# Patient Record
Sex: Female | Born: 1945 | Race: White | Hispanic: No | Marital: Married | Smoking: Never smoker
Health system: Southern US, Community
[De-identification: ages and names within clinical notes are randomized; demographics above are authoritative.]

## PROBLEM LIST (undated history)

## (undated) DIAGNOSIS — R05 Cough: Secondary | ICD-10-CM

## (undated) DIAGNOSIS — R5383 Other fatigue: Secondary | ICD-10-CM

## (undated) DIAGNOSIS — R059 Cough, unspecified: Secondary | ICD-10-CM

## (undated) DIAGNOSIS — M1990 Unspecified osteoarthritis, unspecified site: Secondary | ICD-10-CM

## (undated) DIAGNOSIS — R509 Fever, unspecified: Secondary | ICD-10-CM

## (undated) DIAGNOSIS — R51 Headache: Secondary | ICD-10-CM

## (undated) DIAGNOSIS — R519 Headache, unspecified: Secondary | ICD-10-CM

## (undated) HISTORY — PX: TONSILLECTOMY: SUR1361

## (undated) HISTORY — DX: Cough, unspecified: R05.9

## (undated) HISTORY — DX: Cough: R05

## (undated) HISTORY — DX: Fever, unspecified: R50.9

## (undated) HISTORY — DX: Other fatigue: R53.83

## (undated) HISTORY — DX: Headache: R51

## (undated) HISTORY — PX: TUBAL LIGATION: SHX77

## (undated) HISTORY — DX: Headache, unspecified: R51.9

## (undated) HISTORY — DX: Unspecified osteoarthritis, unspecified site: M19.90

---

## 1999-07-25 ENCOUNTER — Ambulatory Visit (HOSPITAL_COMMUNITY): Admission: RE | Admit: 1999-07-25 | Discharge: 1999-07-25 | Payer: Self-pay | Admitting: Family Medicine

## 2010-09-06 ENCOUNTER — Other Ambulatory Visit: Payer: Self-pay | Admitting: Family Medicine

## 2010-09-06 DIAGNOSIS — R10811 Right upper quadrant abdominal tenderness: Secondary | ICD-10-CM

## 2010-09-07 ENCOUNTER — Ambulatory Visit
Admission: RE | Admit: 2010-09-07 | Discharge: 2010-09-07 | Disposition: A | Discharge status: Home / Self Care | Payer: Managed Care, Other (non HMO) | Source: Ambulatory Visit | Attending: Family Medicine | Admitting: Family Medicine

## 2010-09-07 DIAGNOSIS — R10811 Right upper quadrant abdominal tenderness: Secondary | ICD-10-CM

## 2010-09-27 ENCOUNTER — Encounter (INDEPENDENT_AMBULATORY_CARE_PROVIDER_SITE_OTHER): Payer: Self-pay | Admitting: General Surgery

## 2010-09-27 ENCOUNTER — Ambulatory Visit (INDEPENDENT_AMBULATORY_CARE_PROVIDER_SITE_OTHER): Payer: BC Managed Care – PPO | Admitting: General Surgery

## 2010-09-27 VITALS — BP 156/108 | HR 64 | Temp 96.4°F | Ht 64.5 in | Wt 185.8 lb

## 2010-09-27 DIAGNOSIS — K8020 Calculus of gallbladder without cholecystitis without obstruction: Secondary | ICD-10-CM

## 2010-09-27 NOTE — Patient Instructions (Signed)
Plan for surgery

## 2010-09-27 NOTE — Progress Notes (Signed)
Subjective:     Patient ID: Whitney Burnett, female   DOB: [birth year], 65 y.o.   MRN: [id number]    BP 156/108  Pulse 64  Temp 96.4 F (35.8 C)  Ht 5' 4.5" (1.638 m)  Wt 185 lb 12.8 oz (84.278 kg)  BMI 31.40 kg/m2    HPI The patient is a 65 year old white female who has been experiencing epigastric pain for the last month or 2. The pain is in her epigastric region. The pain has been associated with nausea but no vomiting. She's been running some low-grade fevers at home. She usually experiences severe cramping after eating. This usually occurs in the evening or 2 hours after a meal.  Review of Systems  Constitutional: Positive for fever and fatigue.  HENT: Negative.   Eyes: Negative.   Respiratory: Negative.   Cardiovascular: Negative.   Gastrointestinal: Positive for abdominal pain.  Genitourinary: Positive for hematuria.  Musculoskeletal: Negative.   Neurological: Negative.   Hematological: Negative.   Psychiatric/Behavioral: Negative.    Past Medical History  Diagnosis Date  . Fever   . Fatigue   . Cough     began with gallstones  . Arthritis   . New onset of headaches   . Hematuria    .all Past Surgical History  Procedure Date  . Tubal ligation   . Tonsillectomy    No current outpatient prescriptions on file. No Known Allergies    Objective:   Physical Exam  Constitutional: She is oriented to person, place, and time. She appears well-developed and well-nourished.  HENT:  Head: Normocephalic and atraumatic.  Eyes: EOM are normal. Pupils are equal, round, and reactive to light.  Neck: Neck supple.  Cardiovascular: Normal rate and regular rhythm.   Pulmonary/Chest: Effort normal and breath sounds normal.  Abdominal: Soft. Bowel sounds are normal. There is tenderness.  Musculoskeletal: Normal range of motion.  Neurological: She is alert and oriented to person, place, and time.  Skin: Skin is warm and dry.  Psychiatric: She has a normal mood and affect. Her  behavior is normal.       Assessment:     Symptomatic gallstones    Plan:     I think the patient would benefit from cholecystectomy. I discussed the risks and benefits of the operation as well as on the technical aspects including the risk of common duct injury and she understands and wishes to proceed.

## 2010-10-05 ENCOUNTER — Encounter (HOSPITAL_COMMUNITY)
Admission: RE | Admit: 2010-10-05 | Discharge: 2010-10-05 | Disposition: A | Discharge status: Home / Self Care | Payer: BC Managed Care – PPO | Source: Ambulatory Visit | Attending: General Surgery | Admitting: General Surgery

## 2010-10-05 ENCOUNTER — Other Ambulatory Visit (INDEPENDENT_AMBULATORY_CARE_PROVIDER_SITE_OTHER): Payer: Self-pay | Admitting: General Surgery

## 2010-10-05 DIAGNOSIS — K8020 Calculus of gallbladder without cholecystitis without obstruction: Secondary | ICD-10-CM

## 2010-10-05 LAB — COMPREHENSIVE METABOLIC PANEL
BUN: 12 mg/dL (ref 6–23)
Calcium: 9.6 mg/dL (ref 8.4–10.5)
Creatinine, Ser: 1.14 mg/dL — ABNORMAL HIGH (ref 0.50–1.10)
GFR calc Af Amer: 58 mL/min — ABNORMAL LOW (ref >60)
Glucose, Bld: 81 mg/dL (ref 70–99)
Total Protein: 6.9 g/dL (ref 6.0–8.3)

## 2010-10-05 LAB — CBC
MCV: 89 fL (ref 78.0–100.0)
Platelets: 245 10*3/uL (ref 150–400)
RBC: 4.46 MIL/uL (ref 3.87–5.11)
WBC: 6.1 10*3/uL (ref 4.0–10.5)

## 2010-10-05 LAB — DIFFERENTIAL
Eosinophils Absolute: 0.2 10*3/uL (ref 0.0–0.7)
Lymphocytes Relative: 40 % (ref 12–46)
Lymphs Abs: 2.4 10*3/uL (ref 0.7–4.0)
Neutrophils Relative %: 49 % (ref 43–77)

## 2010-10-05 LAB — SURGICAL PCR SCREEN: MRSA, PCR: NEGATIVE

## 2010-10-09 ENCOUNTER — Ambulatory Visit (HOSPITAL_COMMUNITY): Payer: BC Managed Care – PPO

## 2010-10-09 ENCOUNTER — Ambulatory Visit (HOSPITAL_COMMUNITY)
Admission: RE | Admit: 2010-10-09 | Discharge: 2010-10-09 | Disposition: A | Discharge status: Home / Self Care | Payer: BC Managed Care – PPO | Source: Ambulatory Visit | Attending: General Surgery | Admitting: General Surgery

## 2010-10-09 ENCOUNTER — Other Ambulatory Visit (INDEPENDENT_AMBULATORY_CARE_PROVIDER_SITE_OTHER): Payer: Self-pay | Admitting: General Surgery

## 2010-10-09 DIAGNOSIS — K8010 Calculus of gallbladder with chronic cholecystitis without obstruction: Secondary | ICD-10-CM

## 2010-10-09 DIAGNOSIS — Z01810 Encounter for preprocedural cardiovascular examination: Secondary | ICD-10-CM | POA: Insufficient documentation

## 2010-10-09 DIAGNOSIS — Z01818 Encounter for other preprocedural examination: Secondary | ICD-10-CM | POA: Insufficient documentation

## 2010-10-09 DIAGNOSIS — I10 Essential (primary) hypertension: Secondary | ICD-10-CM | POA: Insufficient documentation

## 2010-10-09 DIAGNOSIS — K8020 Calculus of gallbladder without cholecystitis without obstruction: Secondary | ICD-10-CM | POA: Insufficient documentation

## 2010-10-09 DIAGNOSIS — Z01812 Encounter for preprocedural laboratory examination: Secondary | ICD-10-CM | POA: Insufficient documentation

## 2010-10-09 HISTORY — PX: CHOLECYSTECTOMY: SHX55

## 2010-10-10 NOTE — Op Note (Signed)
NAMECHENEE, Whitney Burnett             ACCOUNT NO.:  [id number]  MEDICAL RECORD NO.:  [id number]  LOCATION:  [facility]                         FACILITY:  [facility]  PHYSICIAN:  Ollen Gross. Vernell Morgans, M.D. DATE OF BIRTH:  May 04, 1945  DATE OF PROCEDURE:  10/09/2010 DATE OF DISCHARGE:  10/09/2010                              OPERATIVE REPORT   PREOPERATIVE DIAGNOSIS:  Gallstones.  POSTOPERATIVE DIAGNOSIS:  Gallstones.  PROCEDURE:  Laparoscopic cholecystectomy with intraoperative cholangiogram.  SURGEON:  Ollen Gross. Vernell Morgans, MD  ANESTHESIA:  General endotracheal.  PROCEDURE:  After informed consent was obtained, the patient was brought to the operating room, placed in supine position on operating table. After adequate induction of general anesthesia, the patient's abdomen was prepped with ChloraPrep, allowed to dry, and draped in usual sterile manner.  The area below the umbilicus was infiltrated with 0.25% Marcaine.  A small incision was made with 15 blade knife.  This incision was carried down through the subcutaneous tissue bluntly with a hemostat and [employer] retractors until the linea alba was identified.  The linea alba was incised with a 15 blade knife and each side was grasped with Kocher clamps and elevated anteriorly.  The preperitoneal space was then probed bluntly with a hemostat until the peritoneum was opened and access was gained to the abdominal cavity.  A 0 Vicryl pursestring stitch was placed in the fascia around the opening. Hasson cannula was placed through the opening and anchored in place with the previous placed Vicryl pursestring stitch.  The abdomen was then insufflated with carbon dioxide without difficulty.  The patient was placed in reverse Trendelenburg position and rotated with the right side up.  The laparoscope was inserted through the Hasson cannula.  The right upper quadrant was inspected.  The dome of the gallbladder and liver readily identified.  Next the  epigastric region was infiltrated with 0.25% Marcaine.  A small incision was made with 15 blade knife and a 10-mm port was placed bluntly through this incision into the abdominal cavity under direct vision.  Sites were then chosen laterally on the right side with placement of 5-mm ports.  Each of these areas were infiltrated with 0.25% Marcaine.  Small stab incisions were made with a 15 blade knife and 5 mm ports were placed bluntly through these incisions into the abdominal cavity under direct vision.  A blunt grasper was placed through the lateral-most 5-mm port and used to grasp the dome of gallbladder and elevated anteriorly and superiorly.  Another blunt grasper was placed through the 5-mm port and used to retract on the body and neck of the gallbladder.  A dissector was placed through the epigastric port and using the electrocautery the peritoneal reflection at the gallbladder neck was opened.  Blunt dissection was then carried out in this area until the gallbladder neck cystic duct junction was readily identified and a good window was created.  We had a very good view in window.  A single clip was placed on the gallbladder neck.  A small ductotomy was made just below the clip with a laparoscopic scissors.  A 14 gauge Angiocath was placed percutaneously through the anterior abdominal wall under direct vision.  A  Reddick cholangiogram catheter was placed through the Angiocath and flushed.  The Reddick catheter then placed in the cystic duct and anchored in place with the clip.  A cholangiogram was obtained that showed no filling defects, good emptying in duodenum and adequate length on cystic duct.  The cystic duct was actually coming off the confluence of the right and left hepatic duct up high but this was this was definitely the cystic duct and we had a beautiful window to see all the anatomy.  The anchoring clip and __________ removed from the patient.  Three clips were  placed proximal in the cystic duct and duct was divided between the 2 sets of clips.  Posterior to this a cystic artery was identified and again dissected bluntly in circumferential manner until a good window was created.  Two clips were placed proximally in the artery and 1 distally and the artery was divided between the 2.  Next a laparoscopic hook cautery device was used to separate the gallbladder from the liver bed. Prior to completely detaching the gallbladder from liver bed, the liver bed was inspected and several small bleeding points were coagulated with the electrocautery until there was completely hemostatic.  There were actually 2 branches of the artery that we clipped.  The gallbladder was then detached from the liver bed with the hook cautery without difficulty.  A laparoscopic bag was inserted through the epigastric port and the gallbladder was placed in the bag and bag was sealed.  The abdomen was then irrigated with copious amounts of saline till the effluent was clear.  The laparoscope was then moved to the epigastric port.  A gallbladder grasper was placed through the Hasson cannula and used to grasp and open the bag.  The bag with the gallbladder was removed with the Hasson cannula through the infraumbilical port without difficulty.  The fascial defect was then closed with previous placed Vicryl pursestring stitch as well as another figure-of-eight 0 Vicryl stitch.  The rest of the ports were then removed under direct vision and were found to be hemostatic.  Gas was allowed to escape.  The skin incisions were all closed with interrupted 4-0 Monocryl subcuticular stitches and Dermabond dressings were applied.  The patient tolerated the procedure well.  At the end of the case, all needle, sponge, and instrument counts were correct.  The patient was then awakened and taken to recovery in stable condition.     Ollen Gross. Vernell Morgans, M.D.     PST/MEDQ  D:  10/09/2010   T:  10/09/2010  Job:  [id number]  Electronically Signed by Chevis Pretty III M.D. on 10/10/2010 01:50:41 PM

## 2010-10-25 ENCOUNTER — Encounter (INDEPENDENT_AMBULATORY_CARE_PROVIDER_SITE_OTHER): Payer: BC Managed Care – PPO | Admitting: General Surgery

## 2010-11-05 ENCOUNTER — Ambulatory Visit (INDEPENDENT_AMBULATORY_CARE_PROVIDER_SITE_OTHER): Payer: Self-pay | Admitting: General Surgery

## 2010-11-05 ENCOUNTER — Encounter (INDEPENDENT_AMBULATORY_CARE_PROVIDER_SITE_OTHER): Payer: Self-pay | Admitting: General Surgery

## 2010-11-05 DIAGNOSIS — K8010 Calculus of gallbladder with chronic cholecystitis without obstruction: Secondary | ICD-10-CM

## 2010-11-05 NOTE — Patient Instructions (Signed)
Try to refrain from strenuous activity for a couple more weeks

## 2010-11-08 ENCOUNTER — Encounter (INDEPENDENT_AMBULATORY_CARE_PROVIDER_SITE_OTHER): Payer: Self-pay | Admitting: General Surgery

## 2010-11-08 NOTE — Progress Notes (Signed)
Subjective:     Patient ID: Whitney Burnett, female   DOB: [birth year], 65 y.o.   MRN: [id number]  HPI The patient is a 65 a white female who is now about 3 weeks out from a laparoscopic cholecystectomy for cholecystitis with cholelithiasis. She feels great. She has no complaints today. Her appetite has been good and her bowels are working normally. Review of Systems     Objective:   Physical Exam On exam her abdomen is soft and nontender. Her incisions are all healing nicely.    Assessment:     Postop from laparoscopic cholecystectomy    Plan:     At this point she is doing very well. I've asked her to refrain from real strenuous activity for another couple weeks and at that point she can return to all her normal activities. We will plan to see her back on a p.r.n. basis.

## 2012-01-01 IMAGING — CR DG CHEST 2V
2 series · 2 of 2 positions shown · non-contrast
Comparison: None.

CLINICAL DATA: Preoperative evaluation for laparoscopic
cholecystectomy.  Dry cough for several days.  Nonsmoker

CHEST - 2 VIEW

[view not recorded (1 of 2)]
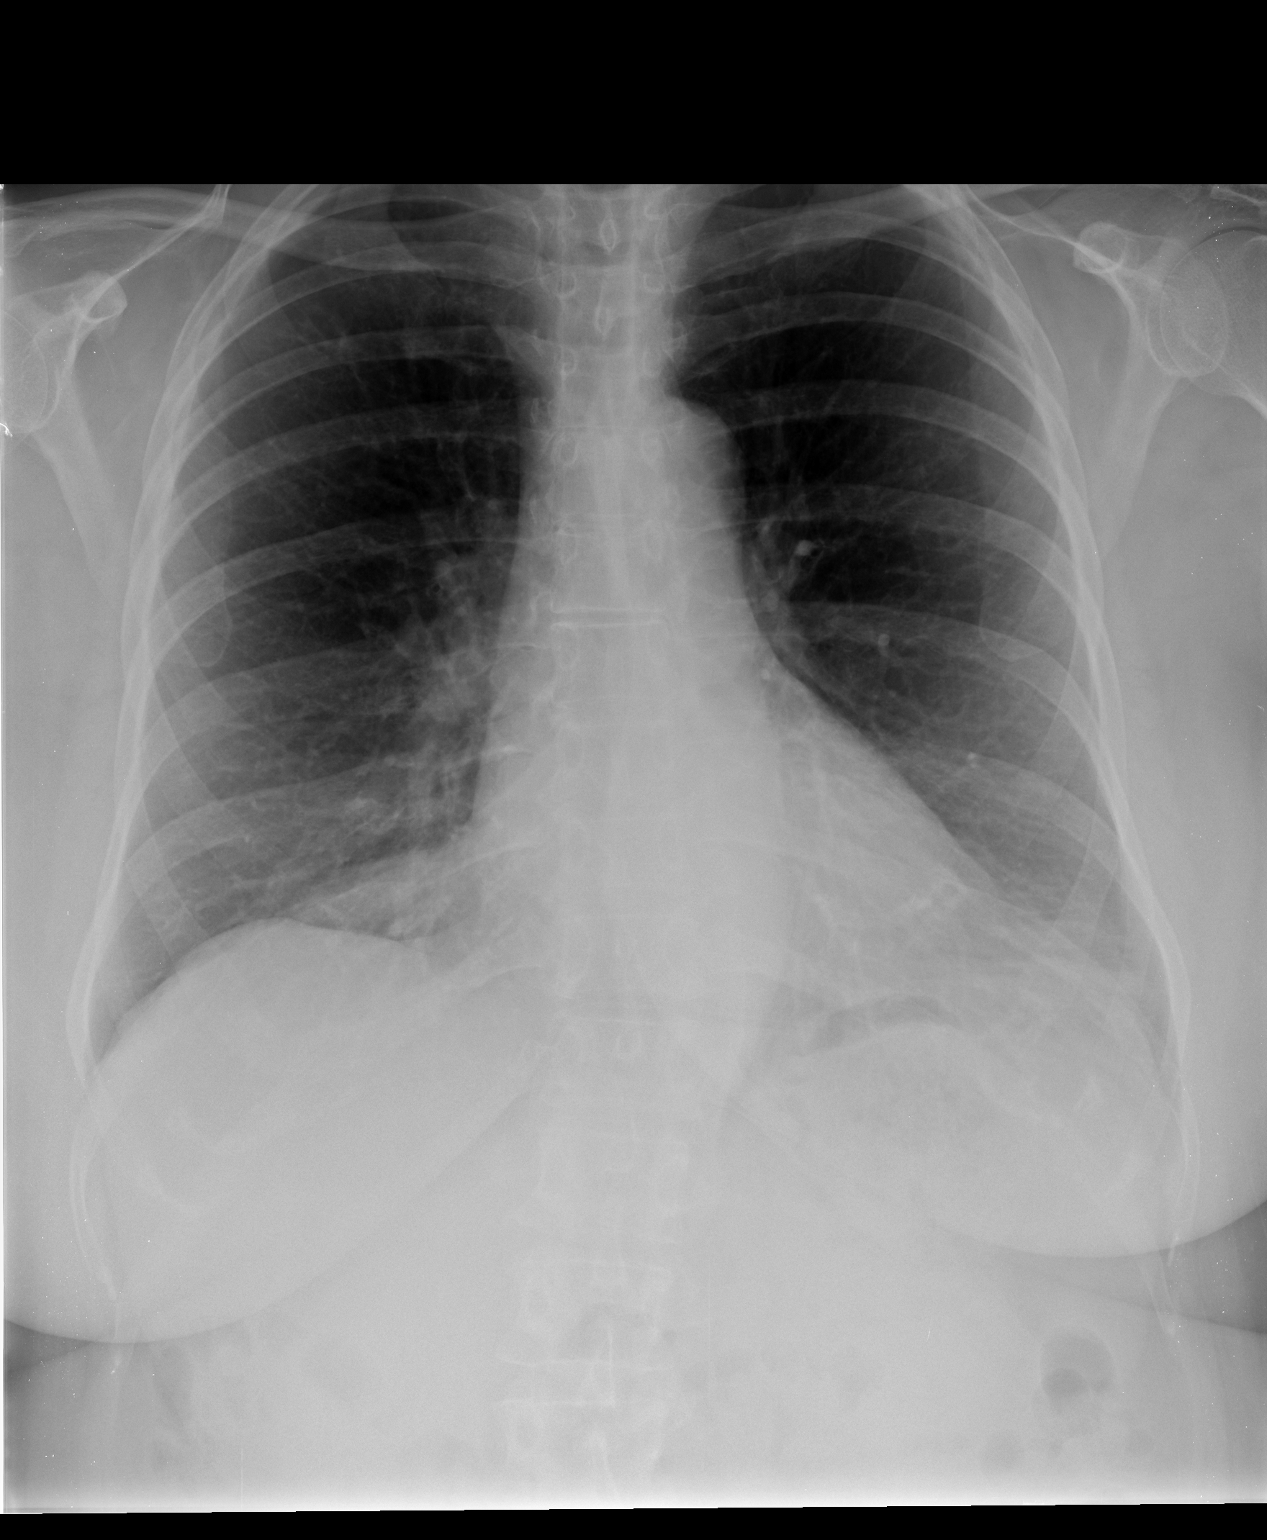

[view not recorded (2 of 2)]
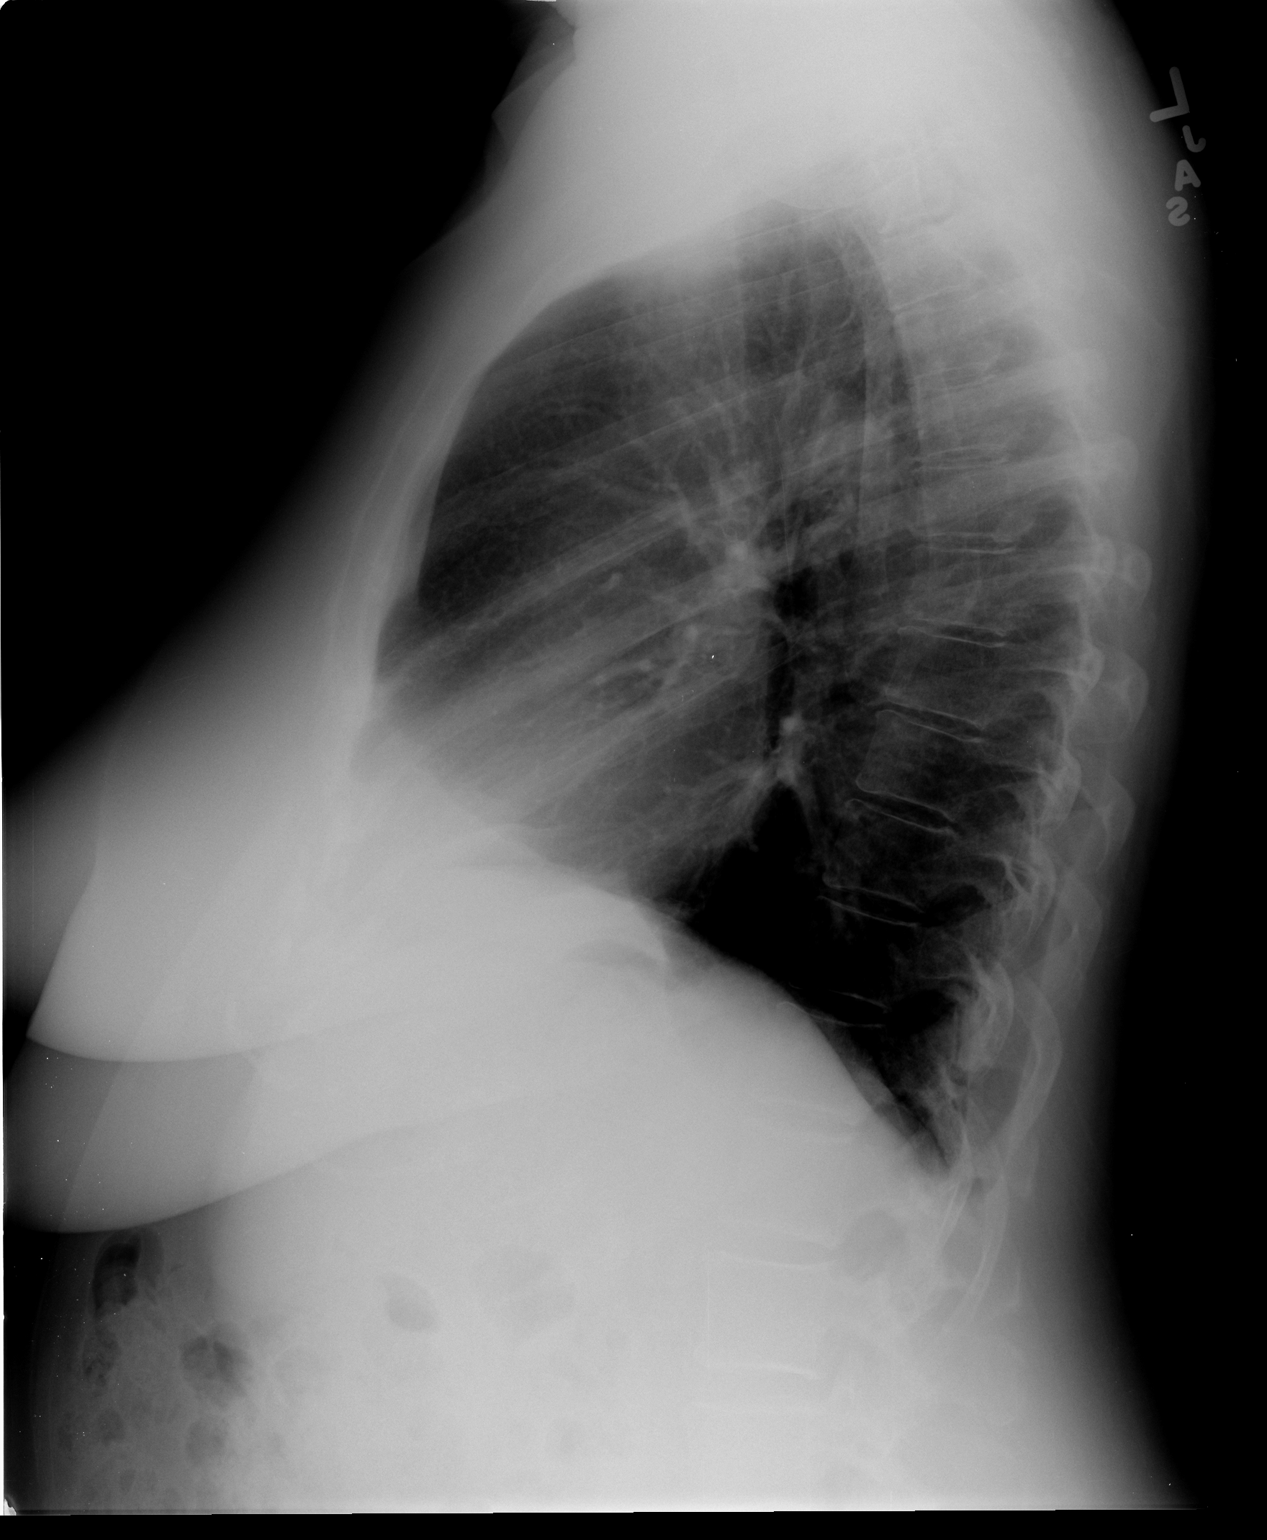

[2 of 2 positions shown; findings below may reference images not displayed]

FINDINGS: Heart and mediastinal contours are within normal limits.
The lung fields are clear with no signs of focal infiltrate or
congestive failure.  No pleural fluid or peribronchial cuffing is
seen.

Bony structures appear intact.
IMPRESSION: No worrisome focal or acute cardiopulmonary abnormality noted

## 2013-05-05 ENCOUNTER — Other Ambulatory Visit: Payer: Self-pay

## 2013-05-05 DIAGNOSIS — Z1231 Encounter for screening mammogram for malignant neoplasm of breast: Secondary | ICD-10-CM

## 2013-05-21 ENCOUNTER — Ambulatory Visit: Payer: BC Managed Care – PPO

## 2013-05-24 ENCOUNTER — Ambulatory Visit
Admission: RE | Admit: 2013-05-24 | Discharge: 2013-05-24 | Disposition: A | Discharge status: Home / Self Care | Payer: Self-pay | Source: Ambulatory Visit

## 2013-05-24 DIAGNOSIS — Z1231 Encounter for screening mammogram for malignant neoplasm of breast: Secondary | ICD-10-CM

## 2015-11-17 ENCOUNTER — Encounter: Payer: Self-pay | Admitting: Vascular Surgery

## 2015-11-21 ENCOUNTER — Encounter: Payer: Self-pay | Admitting: Vascular Surgery

## 2016-01-09 ENCOUNTER — Encounter: Payer: Self-pay | Admitting: Vascular Surgery

## 2019-07-15 ENCOUNTER — Other Ambulatory Visit: Payer: Self-pay | Admitting: Family Medicine

## 2019-07-15 ENCOUNTER — Ambulatory Visit
Admission: RE | Admit: 2019-07-15 | Discharge: 2019-07-15 | Disposition: A | Discharge status: Home / Self Care | Payer: Medicare Other | Source: Ambulatory Visit | Attending: Family Medicine | Admitting: Family Medicine

## 2019-07-15 DIAGNOSIS — M25511 Pain in right shoulder: Secondary | ICD-10-CM

## 2020-08-04 ENCOUNTER — Ambulatory Visit: Payer: Medicare Other | Admitting: Urology

## 2020-09-22 ENCOUNTER — Ambulatory Visit: Payer: Medicare Other | Admitting: Urology

## 2020-10-10 IMAGING — CR DG SHOULDER 2+V*R*
4 series · 4 of 4 positions shown · non-contrast
Comparison: None.

CLINICAL DATA: Right anterior shoulder pain for the past 3 weeks.
No injury.

EXAM:
RIGHT SHOULDER - 2+ VIEW

[w shoulder ap internal righ *]
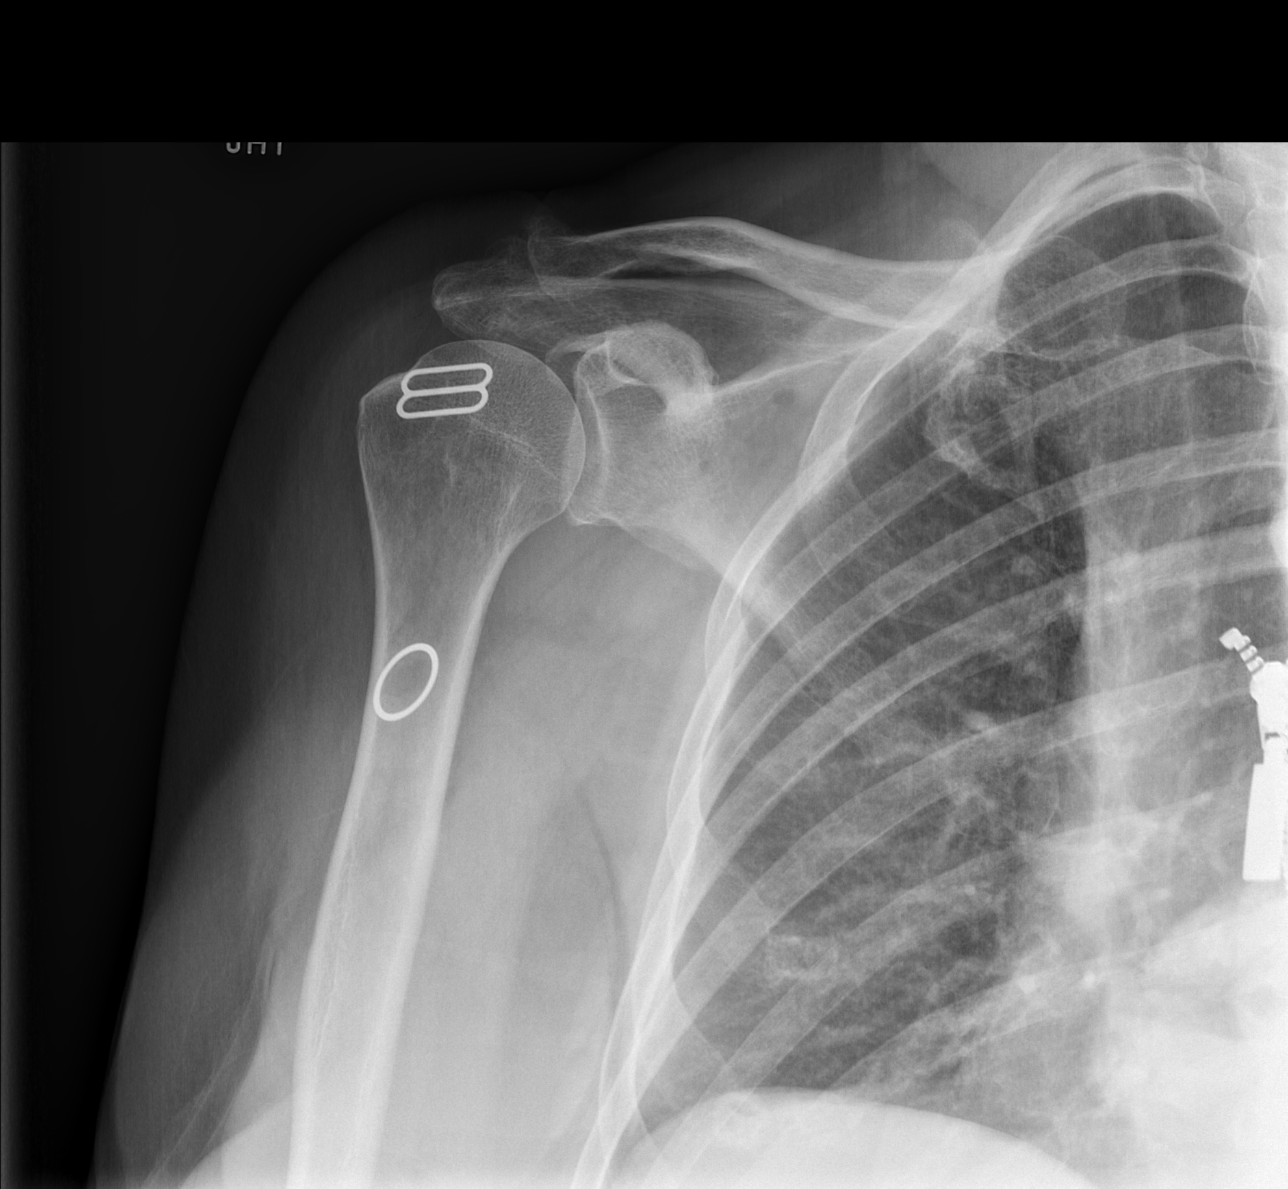

[w shoulder ap external righ *]
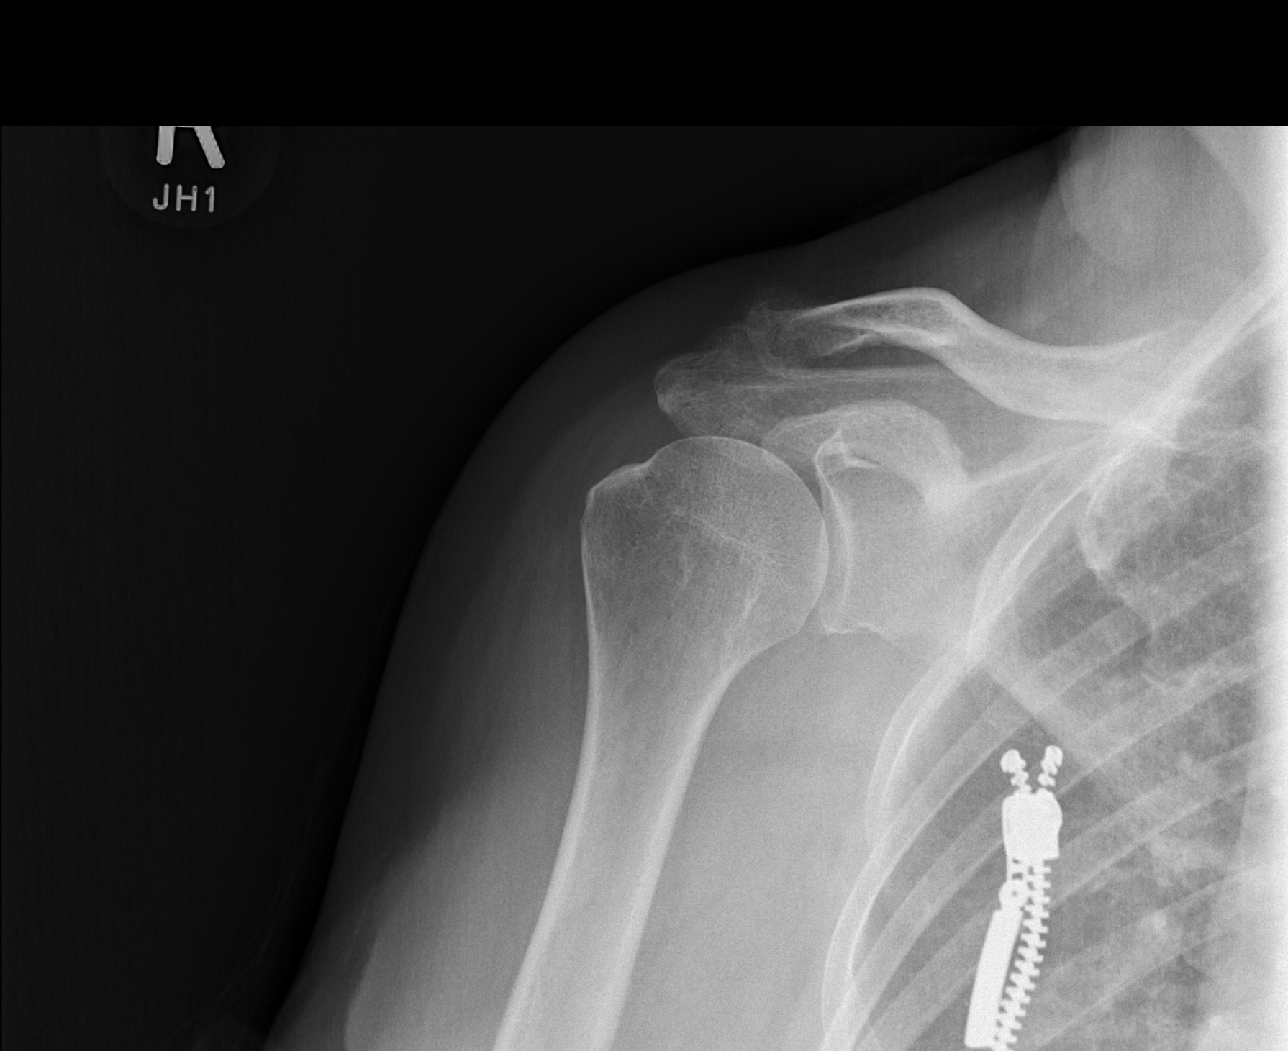

[w shoulder y view right *]
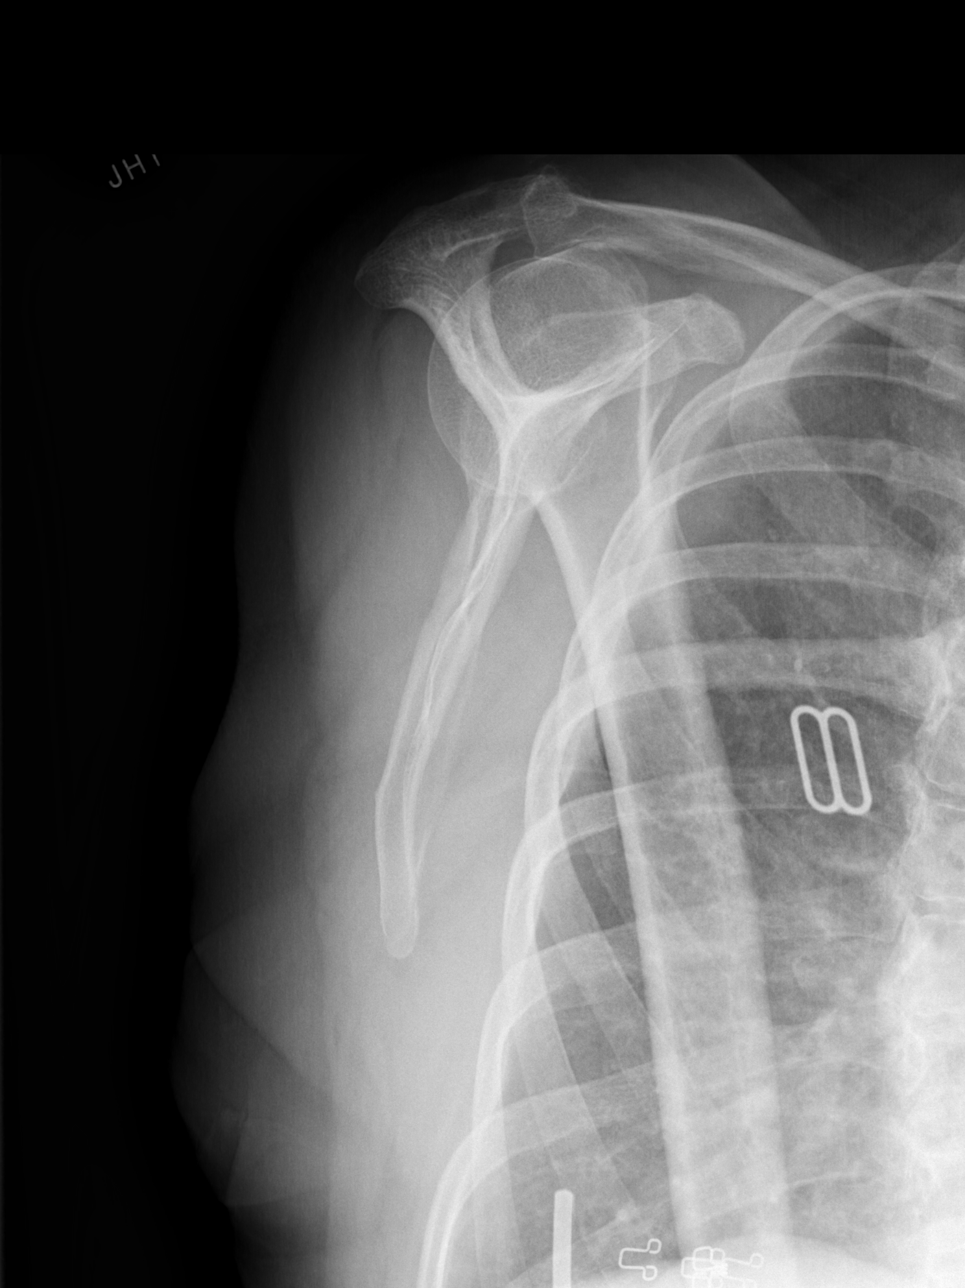

[x shoulder axillary right]
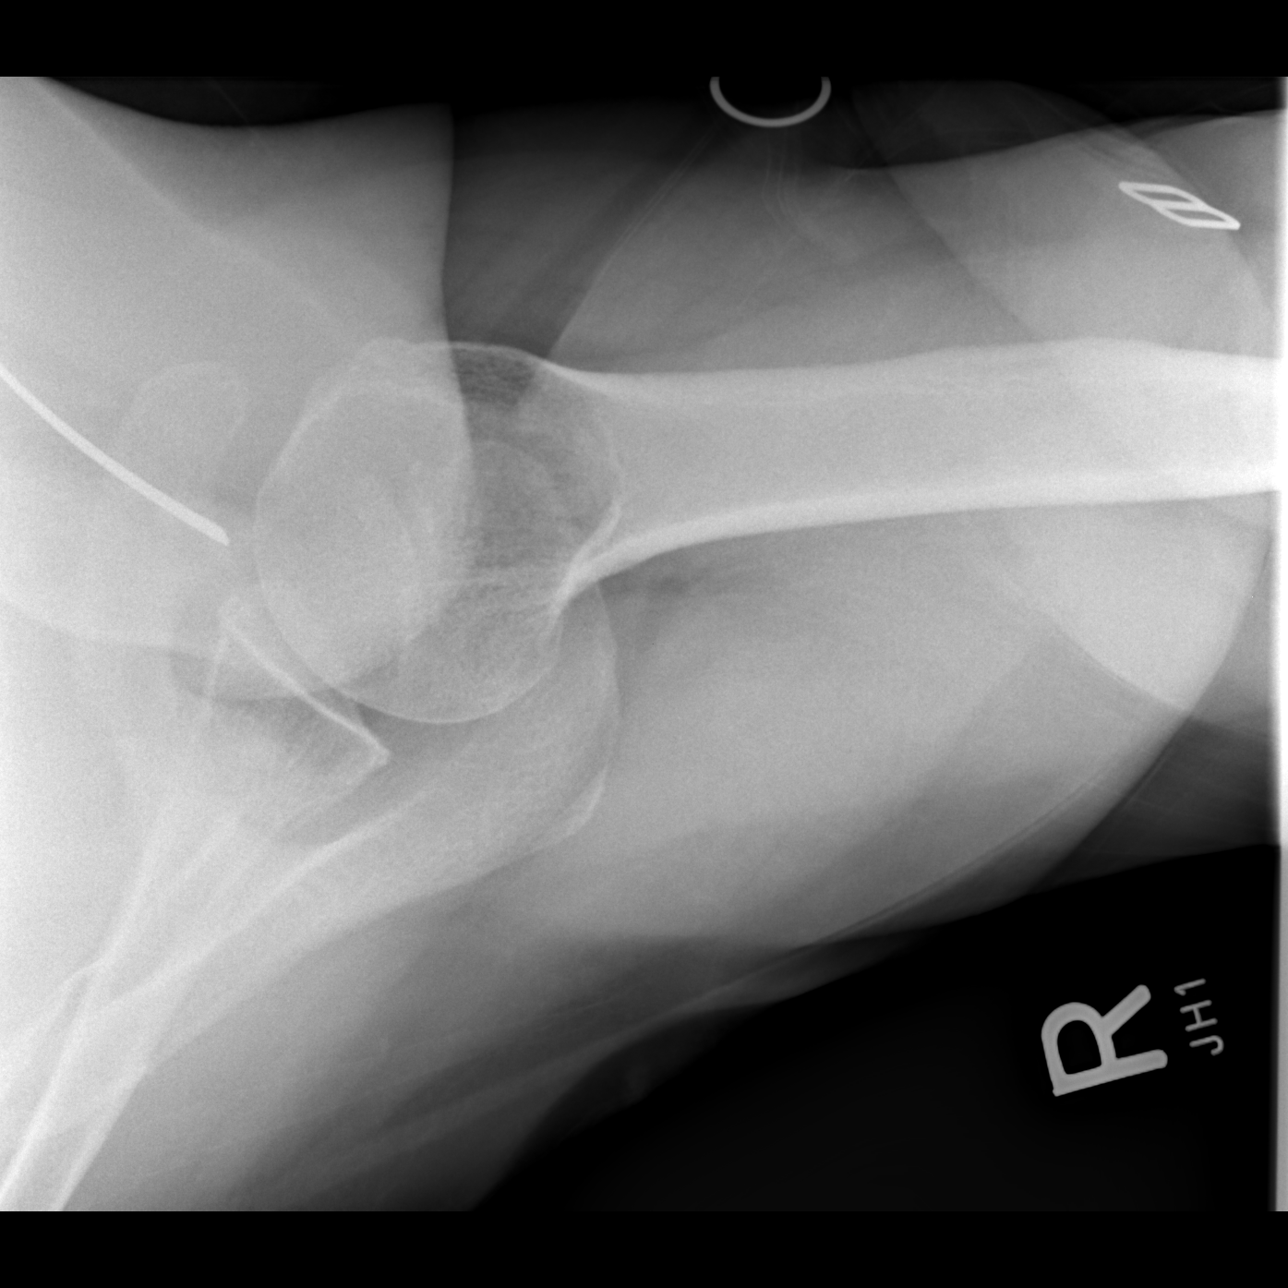

[4 of 4 positions shown; findings below may reference images not displayed]

FINDINGS: No acute fracture or dislocation. Moderate acromioclavicular joint
space narrowing and marginal spurring. Mild glenohumeral joint space
narrowing with small glenoid marginal osteophytes. Soft tissues are
unremarkable.
IMPRESSION: 1. Moderate acromioclavicular and mild glenohumeral osteoarthritis.
No acute osseous abnormality.
